# Patient Record
Sex: Male | Born: 1975 | Race: White | Hispanic: No | Marital: Married | Smoking: Never smoker
Health system: Southern US, Community
[De-identification: ages and names within clinical notes are randomized; demographics above are authoritative.]

## PROBLEM LIST (undated history)

## (undated) DIAGNOSIS — F1010 Alcohol abuse, uncomplicated: Secondary | ICD-10-CM

## (undated) HISTORY — PX: EYE SURGERY: SHX253

---

## 1999-11-20 ENCOUNTER — Ambulatory Visit (HOSPITAL_BASED_OUTPATIENT_CLINIC_OR_DEPARTMENT_OTHER): Admission: RE | Admit: 1999-11-20 | Discharge: 1999-11-20 | Payer: Self-pay | Admitting: Otolaryngology

## 2016-03-08 DIAGNOSIS — Z7189 Other specified counseling: Secondary | ICD-10-CM | POA: Diagnosis not present

## 2016-03-08 DIAGNOSIS — I10 Essential (primary) hypertension: Secondary | ICD-10-CM | POA: Diagnosis not present

## 2016-03-08 DIAGNOSIS — F329 Major depressive disorder, single episode, unspecified: Secondary | ICD-10-CM | POA: Diagnosis not present

## 2017-08-07 DIAGNOSIS — F331 Major depressive disorder, recurrent, moderate: Secondary | ICD-10-CM | POA: Diagnosis not present

## 2017-08-07 DIAGNOSIS — S0340XA Sprain of jaw, unspecified side, initial encounter: Secondary | ICD-10-CM | POA: Diagnosis not present

## 2017-08-07 DIAGNOSIS — R293 Abnormal posture: Secondary | ICD-10-CM | POA: Diagnosis not present

## 2017-09-05 DIAGNOSIS — Z23 Encounter for immunization: Secondary | ICD-10-CM | POA: Diagnosis not present

## 2018-04-16 DIAGNOSIS — M9903 Segmental and somatic dysfunction of lumbar region: Secondary | ICD-10-CM | POA: Diagnosis not present

## 2018-04-16 DIAGNOSIS — M9901 Segmental and somatic dysfunction of cervical region: Secondary | ICD-10-CM | POA: Diagnosis not present

## 2018-04-16 DIAGNOSIS — M9902 Segmental and somatic dysfunction of thoracic region: Secondary | ICD-10-CM | POA: Diagnosis not present

## 2018-04-17 DIAGNOSIS — M9903 Segmental and somatic dysfunction of lumbar region: Secondary | ICD-10-CM | POA: Diagnosis not present

## 2018-04-17 DIAGNOSIS — F4323 Adjustment disorder with mixed anxiety and depressed mood: Secondary | ICD-10-CM | POA: Diagnosis not present

## 2018-04-17 DIAGNOSIS — M9902 Segmental and somatic dysfunction of thoracic region: Secondary | ICD-10-CM | POA: Diagnosis not present

## 2018-04-17 DIAGNOSIS — M5030 Other cervical disc degeneration, unspecified cervical region: Secondary | ICD-10-CM | POA: Diagnosis not present

## 2018-04-17 DIAGNOSIS — M9901 Segmental and somatic dysfunction of cervical region: Secondary | ICD-10-CM | POA: Diagnosis not present

## 2018-04-22 DIAGNOSIS — M9903 Segmental and somatic dysfunction of lumbar region: Secondary | ICD-10-CM | POA: Diagnosis not present

## 2018-04-22 DIAGNOSIS — M9901 Segmental and somatic dysfunction of cervical region: Secondary | ICD-10-CM | POA: Diagnosis not present

## 2018-04-22 DIAGNOSIS — M9902 Segmental and somatic dysfunction of thoracic region: Secondary | ICD-10-CM | POA: Diagnosis not present

## 2018-04-22 DIAGNOSIS — M5030 Other cervical disc degeneration, unspecified cervical region: Secondary | ICD-10-CM | POA: Diagnosis not present

## 2018-04-24 DIAGNOSIS — M5030 Other cervical disc degeneration, unspecified cervical region: Secondary | ICD-10-CM | POA: Diagnosis not present

## 2018-04-24 DIAGNOSIS — M9902 Segmental and somatic dysfunction of thoracic region: Secondary | ICD-10-CM | POA: Diagnosis not present

## 2018-04-24 DIAGNOSIS — F4323 Adjustment disorder with mixed anxiety and depressed mood: Secondary | ICD-10-CM | POA: Diagnosis not present

## 2018-04-24 DIAGNOSIS — M9903 Segmental and somatic dysfunction of lumbar region: Secondary | ICD-10-CM | POA: Diagnosis not present

## 2018-04-24 DIAGNOSIS — M9901 Segmental and somatic dysfunction of cervical region: Secondary | ICD-10-CM | POA: Diagnosis not present

## 2018-04-27 DIAGNOSIS — M9901 Segmental and somatic dysfunction of cervical region: Secondary | ICD-10-CM | POA: Diagnosis not present

## 2018-04-27 DIAGNOSIS — M9903 Segmental and somatic dysfunction of lumbar region: Secondary | ICD-10-CM | POA: Diagnosis not present

## 2018-04-27 DIAGNOSIS — M9902 Segmental and somatic dysfunction of thoracic region: Secondary | ICD-10-CM | POA: Diagnosis not present

## 2018-04-27 DIAGNOSIS — M5030 Other cervical disc degeneration, unspecified cervical region: Secondary | ICD-10-CM | POA: Diagnosis not present

## 2018-04-29 DIAGNOSIS — M9902 Segmental and somatic dysfunction of thoracic region: Secondary | ICD-10-CM | POA: Diagnosis not present

## 2018-04-29 DIAGNOSIS — M9903 Segmental and somatic dysfunction of lumbar region: Secondary | ICD-10-CM | POA: Diagnosis not present

## 2018-04-29 DIAGNOSIS — M9901 Segmental and somatic dysfunction of cervical region: Secondary | ICD-10-CM | POA: Diagnosis not present

## 2018-04-29 DIAGNOSIS — M5030 Other cervical disc degeneration, unspecified cervical region: Secondary | ICD-10-CM | POA: Diagnosis not present

## 2018-05-01 DIAGNOSIS — M5030 Other cervical disc degeneration, unspecified cervical region: Secondary | ICD-10-CM | POA: Diagnosis not present

## 2018-05-01 DIAGNOSIS — M9903 Segmental and somatic dysfunction of lumbar region: Secondary | ICD-10-CM | POA: Diagnosis not present

## 2018-05-01 DIAGNOSIS — M9901 Segmental and somatic dysfunction of cervical region: Secondary | ICD-10-CM | POA: Diagnosis not present

## 2018-05-01 DIAGNOSIS — M9902 Segmental and somatic dysfunction of thoracic region: Secondary | ICD-10-CM | POA: Diagnosis not present

## 2018-05-04 DIAGNOSIS — M9901 Segmental and somatic dysfunction of cervical region: Secondary | ICD-10-CM | POA: Diagnosis not present

## 2018-05-04 DIAGNOSIS — M9902 Segmental and somatic dysfunction of thoracic region: Secondary | ICD-10-CM | POA: Diagnosis not present

## 2018-05-04 DIAGNOSIS — M9903 Segmental and somatic dysfunction of lumbar region: Secondary | ICD-10-CM | POA: Diagnosis not present

## 2018-05-04 DIAGNOSIS — M5030 Other cervical disc degeneration, unspecified cervical region: Secondary | ICD-10-CM | POA: Diagnosis not present

## 2018-05-06 DIAGNOSIS — M9902 Segmental and somatic dysfunction of thoracic region: Secondary | ICD-10-CM | POA: Diagnosis not present

## 2018-05-06 DIAGNOSIS — M9901 Segmental and somatic dysfunction of cervical region: Secondary | ICD-10-CM | POA: Diagnosis not present

## 2018-05-06 DIAGNOSIS — M5030 Other cervical disc degeneration, unspecified cervical region: Secondary | ICD-10-CM | POA: Diagnosis not present

## 2018-05-06 DIAGNOSIS — M9903 Segmental and somatic dysfunction of lumbar region: Secondary | ICD-10-CM | POA: Diagnosis not present

## 2018-05-08 DIAGNOSIS — M9901 Segmental and somatic dysfunction of cervical region: Secondary | ICD-10-CM | POA: Diagnosis not present

## 2018-05-08 DIAGNOSIS — M5030 Other cervical disc degeneration, unspecified cervical region: Secondary | ICD-10-CM | POA: Diagnosis not present

## 2018-05-08 DIAGNOSIS — M9903 Segmental and somatic dysfunction of lumbar region: Secondary | ICD-10-CM | POA: Diagnosis not present

## 2018-05-08 DIAGNOSIS — M9902 Segmental and somatic dysfunction of thoracic region: Secondary | ICD-10-CM | POA: Diagnosis not present

## 2018-06-11 ENCOUNTER — Other Ambulatory Visit: Payer: Self-pay

## 2018-06-11 ENCOUNTER — Emergency Department (HOSPITAL_COMMUNITY)
Admission: EM | Admit: 2018-06-11 | Discharge: 2018-06-11 | Disposition: A | Discharge status: Home / Self Care | Payer: BLUE CROSS/BLUE SHIELD | Attending: Emergency Medicine | Admitting: Emergency Medicine

## 2018-06-11 ENCOUNTER — Emergency Department (HOSPITAL_COMMUNITY): Payer: BLUE CROSS/BLUE SHIELD

## 2018-06-11 ENCOUNTER — Encounter (HOSPITAL_COMMUNITY): Payer: Self-pay

## 2018-06-11 DIAGNOSIS — W2209XA Striking against other stationary object, initial encounter: Secondary | ICD-10-CM | POA: Insufficient documentation

## 2018-06-11 DIAGNOSIS — S0993XA Unspecified injury of face, initial encounter: Secondary | ICD-10-CM | POA: Diagnosis not present

## 2018-06-11 DIAGNOSIS — Y939 Activity, unspecified: Secondary | ICD-10-CM | POA: Insufficient documentation

## 2018-06-11 DIAGNOSIS — Y999 Unspecified external cause status: Secondary | ICD-10-CM | POA: Insufficient documentation

## 2018-06-11 DIAGNOSIS — Y929 Unspecified place or not applicable: Secondary | ICD-10-CM | POA: Diagnosis not present

## 2018-06-11 HISTORY — DX: Alcohol abuse, uncomplicated: F10.10

## 2018-06-11 NOTE — Discharge Instructions (Addendum)
Your x-rays today were normal.  There is a slight deviation on the your nose towards the right.  I have provided a referral for an ENT specialist, please schedule an appointment as needed.  If any of your symptoms worsen or you experience any pain with eye movement, changes in your ability to smell return to the ED for reevaluation

## 2018-06-11 NOTE — ED Provider Notes (Signed)
[facility] EMERGENCY DEPARTMENT Provider Note   CSN: [id number] Arrival date & time: 06/11/18  2036     History   Chief Complaint Chief Complaint  Patient presents with  . Facial Injury    nose    HPI Christian Hawkins is a 42 y.o. male.  42 y/o male with no PMH presents to the ED s/p facial injury x 2 hours ago.  Patient reports he was closing the door of his hatchback car when he struck his nose against the door.  He states this was a severe impact.  He reports there was blood coming from the bridge of his nose but no internal blood.  He applied pressure to his nose and reports he got the bleeding to stop on the bridge of his nose.  He has not placed ice or taken any pain for relief.  He denies any difficulty with smell or decrease in smell, pain with eye movement, other complaints or headache.     Past Medical History:  Diagnosis Date  . Alcohol abuse    quit 4 years ago    There are no active problems to display for this patient.   Past Surgical History:  Procedure Laterality Date  . EYE SURGERY          Home Medications    Prior to Admission medications   Not on File    Family History History reviewed. No pertinent family history.  Social History Social History   Tobacco Use  . Smoking status: Never Smoker  . Smokeless tobacco: Never Used  Substance Use Topics  . Alcohol use: Yes  . Drug use: Never     Allergies   Patient has no known allergies.   Review of Systems Review of Systems  Constitutional: Negative for chills and fever.  HENT: Negative for nosebleeds and rhinorrhea.      Physical Exam Updated Vital Signs BP 120/85 (BP Location: Right Arm)   Pulse 82   Temp 98.7 F (37.1 C) (Oral)   Resp 15   SpO2 98%   Physical Exam  HENT:  Head: Normocephalic.    Nose: Nasal deformity present. No mucosal edema or rhinorrhea. No epistaxis.  No foreign bodies. Right sinus exhibits no maxillary sinus tenderness and  no frontal sinus tenderness. Left sinus exhibits no maxillary sinus tenderness and no frontal sinus tenderness.  No seems to be deviated slightly to the right side but patient reports no decrease in smelling, pain with eye movement, pain with pressing of his nose.     ED Treatments / Results  Labs (all labs ordered are listed, but only abnormal results are displayed) Labs Reviewed - No data to display  EKG None  Radiology Dg Nasal Bones  Result Date: 06/11/2018 CLINICAL DATA:  Nose injury.  Ran into door. EXAM: NASAL BONES - 3+ VIEW COMPARISON:  None. FINDINGS: There is no evidence of fracture or other bone abnormality. IMPRESSION: Negative. Electronically Signed   By: Charlett Nose M.D.   On: 06/11/2018 22:05    Procedures Procedures (including critical care time)  Medications Ordered in ED Medications - No data to display   Initial Impression / Assessment and Plan / ED Course  I have reviewed the triage vital signs and the nursing notes.  Pertinent labs & imaging results that were available during my care of the patient were reviewed by me and considered in my medical decision making (see chart for details).  Presents with visual injury after his  nose with the door of his hatchback.  Bleeding is controlled.  DG nasal bones ordered there is no evidence of fracture or other bone abnormality.  Patient has no decrease in smelling, epistaxis, pain with eye movement.  At this time I have advised patient I will provided with a referral to ENT should he have any symptoms he needs to follow-up with them.  There is a slight deviation to his right towards the right side.  She may take Tylenol or ibuprofen for the pain.  Vitals stable during ED visit, patient stable for discharge.  Final Clinical Impressions(s) / ED Diagnoses   Final diagnoses:  Facial injury, initial encounter    ED Discharge Orders    None       Claude Manges, Cordelia Poche 06/11/18 2305    Jacalyn Lefevre, MD 06/11/18  2306

## 2018-06-11 NOTE — ED Triage Notes (Signed)
Pt here for running into the door of his car and feeling as if he broke his nose.  Broke his nose in the past.  Having swelling to bilateral lower orbits.  No difficulty breathing, no blood from the nose.  Small abrasion to the bridge of the nose. A&Ox4

## 2018-06-11 NOTE — ED Notes (Signed)
Pt reports hitting his nose on the hatchback of his vehicle. Pt believes his nose is broken but endorses no pain.

## 2019-08-16 IMAGING — CR DG NASAL BONES 3+V
3 series · 3 of 3 positions shown · non-contrast
Comparison: None.

CLINICAL DATA: Nose injury.  Ran into door.

EXAM:
NASAL BONES - 3+ VIEW

[nasal waters]
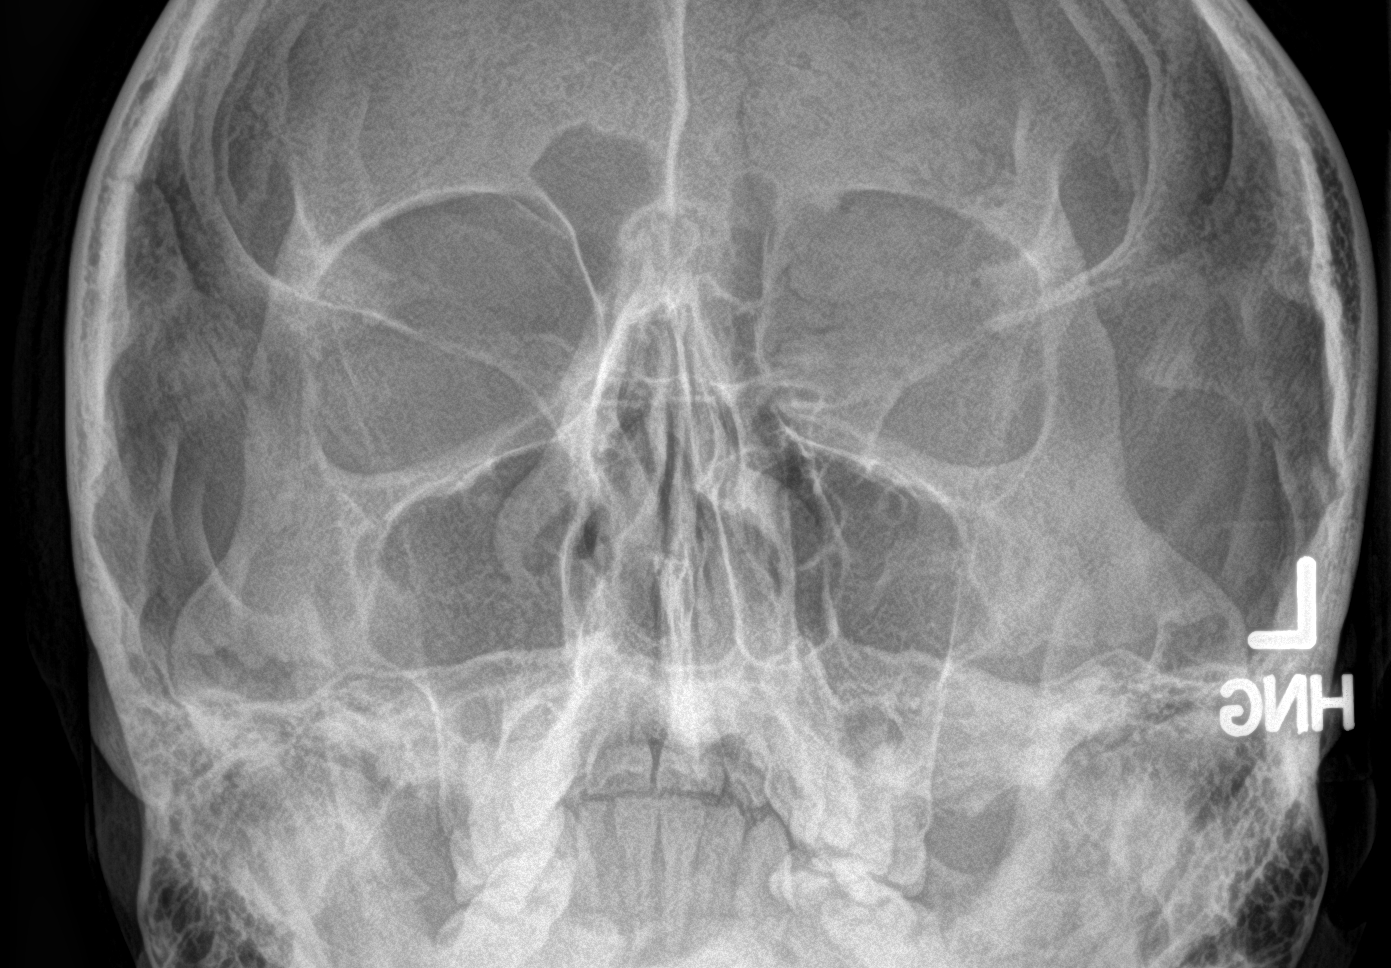

[nasal lat (1 of 2)]
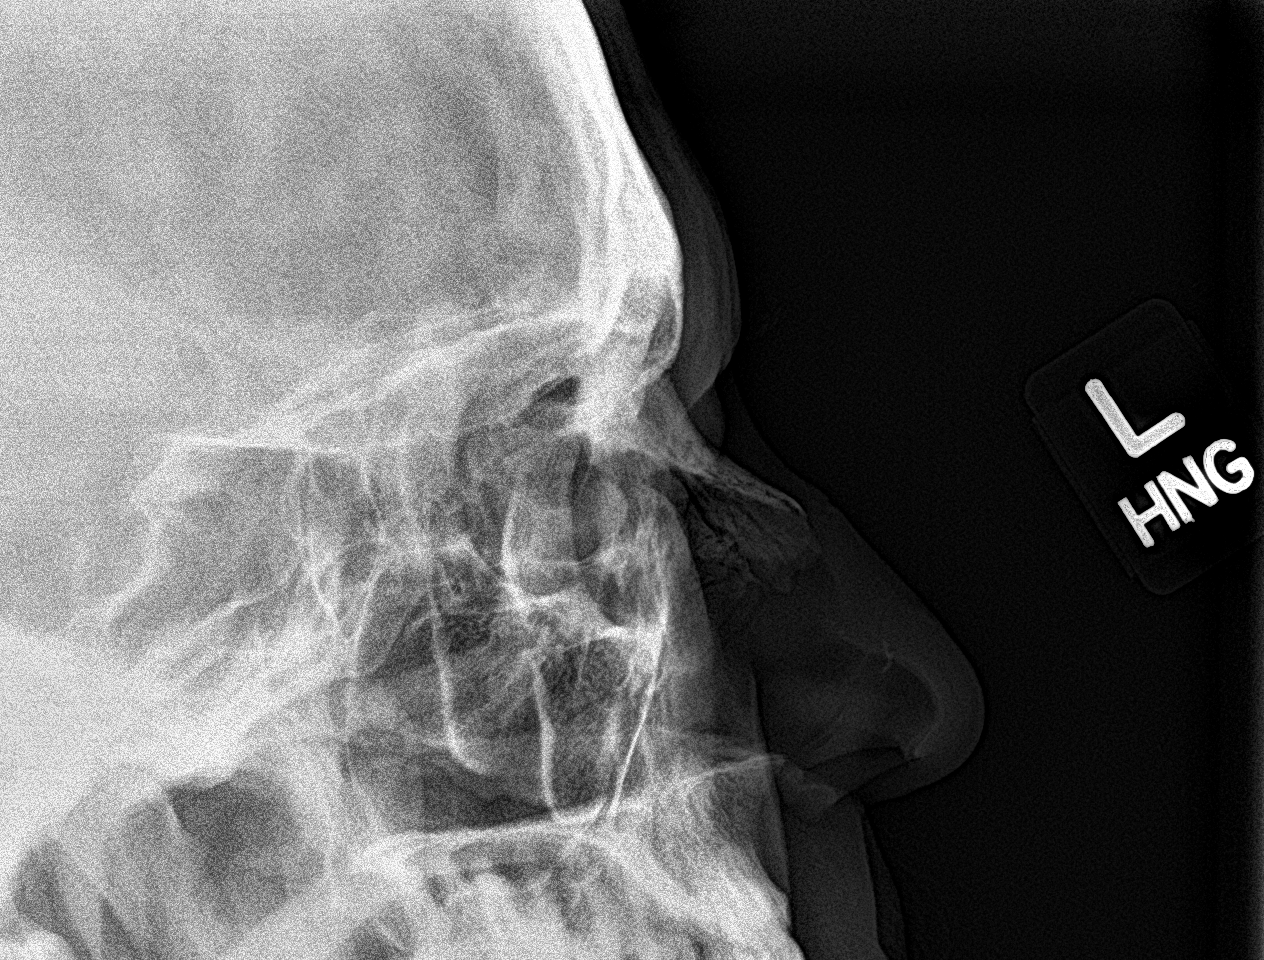

[nasal lat (2 of 2)]
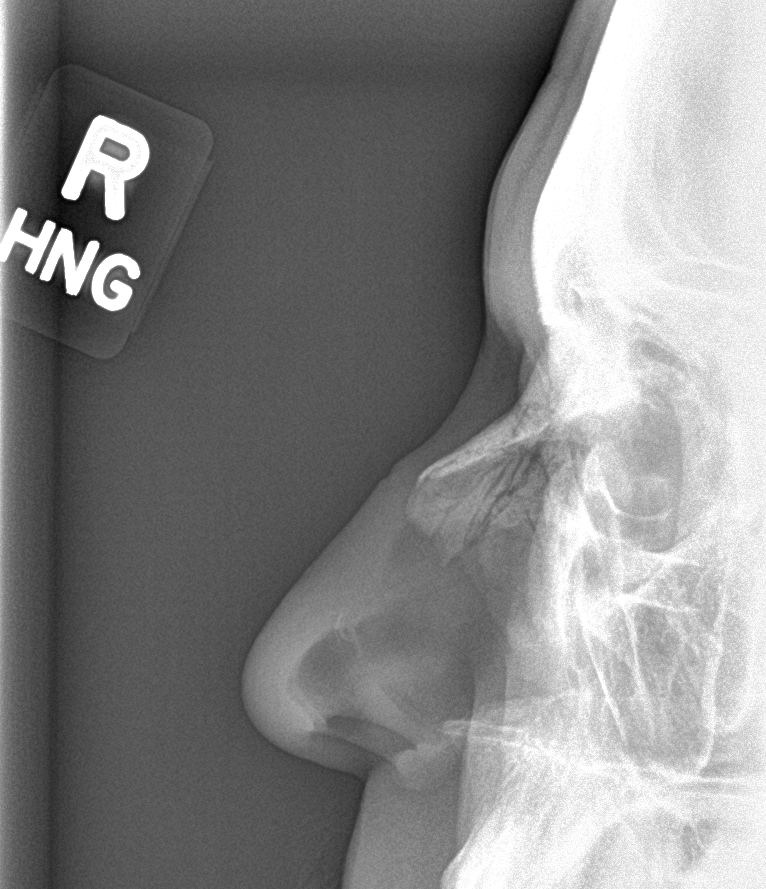

[3 of 3 positions shown; findings below may reference images not displayed]

FINDINGS: There is no evidence of fracture or other bone abnormality.
IMPRESSION: Negative.

## 2019-10-07 ENCOUNTER — Ambulatory Visit: Payer: Self-pay | Admitting: Podiatry

## 2019-10-12 ENCOUNTER — Ambulatory Visit (INDEPENDENT_AMBULATORY_CARE_PROVIDER_SITE_OTHER): Payer: 59

## 2019-10-12 ENCOUNTER — Encounter: Payer: Self-pay | Admitting: Podiatry

## 2019-10-12 ENCOUNTER — Other Ambulatory Visit: Payer: Self-pay

## 2019-10-12 ENCOUNTER — Ambulatory Visit: Payer: 59 | Admitting: Podiatry

## 2019-10-12 VITALS — BP 139/80 | HR 67 | Temp 96.4°F | Resp 16

## 2019-10-12 DIAGNOSIS — M722 Plantar fascial fibromatosis: Secondary | ICD-10-CM

## 2019-10-12 MED ORDER — MELOXICAM 15 MG PO TABS: 15.0000 mg | ORAL_TABLET | Freq: Every day | ORAL | 3 refills | Status: AC

## 2019-10-12 MED ORDER — METHYLPREDNISOLONE 4 MG PO TBPK: ORAL_TABLET | ORAL | 0 refills | Status: AC

## 2019-10-12 NOTE — Patient Instructions (Signed)

## 2019-10-13 NOTE — Progress Notes (Signed)
  Subjective:  Patient ID: Christian Hawkins, male    DOB: 08-12-1976,  MRN: [id number] HPI Chief Complaint  Patient presents with  . Foot Pain    Plantar heel bilateral (L>R) - aching x couple months, been walking more since gym closures, treid stretching, has hiking trip in June  . New Patient (Initial Visit)    44 y.o. male presents with the above complaint.   ROS: Denies fever chills nausea vomiting muscle aches pains calf pain back pain chest pain shortness of breath.  Past Medical History:  Diagnosis Date  . Alcohol abuse    quit 4 years ago   Past Surgical History:  Procedure Laterality Date  . EYE SURGERY      Current Outpatient Medications:  .  meloxicam (MOBIC) 15 MG tablet, Take 1 tablet (15 mg total) by mouth daily., Disp: 30 tablet, Rfl: 3 .  methylPREDNISolone (MEDROL DOSEPAK) 4 MG TBPK tablet, 6 day dose pack - take as directed, Disp: 21 tablet, Rfl: 0 .  sertraline (ZOLOFT) 100 MG tablet, Take 100 mg by mouth at bedtime., Disp: , Rfl:  .  sertraline (ZOLOFT) 50 MG tablet, Take 50 mg by mouth at bedtime., Disp: , Rfl:   No Known Allergies Review of Systems Objective:   Vitals:   10/12/19 1543  BP: 139/80  Pulse: 67  Resp: 16  Temp: (!) 96.4 F (35.8 C)    General: Well developed, nourished, in no acute distress, alert and oriented x3   Dermatological: Skin is warm, dry and supple bilateral. Nails x 10 are well maintained; remaining integument appears unremarkable at this time. There are no open sores, no preulcerative lesions, no rash or signs of infection present.  Vascular: Dorsalis Pedis artery and Posterior Tibial artery pedal pulses are 2/4 bilateral with immedate capillary fill time. Pedal hair growth present. No varicosities and no lower extremity edema present bilateral.   Neruologic: Grossly intact via light touch bilateral. Vibratory intact via tuning fork bilateral. Protective threshold with Semmes Wienstein monofilament intact to all pedal  sites bilateral. Patellar and Achilles deep tendon reflexes 2+ bilateral. No Babinski or clonus noted bilateral.   Musculoskeletal: No gross boney pedal deformities bilateral. No pain, crepitus, or limitation noted with foot and ankle range of motion bilateral. Muscular strength 5/5 in all groups tested bilateral.  Pain on palpation medial calcaneal tubercle of the right proximal medial longitudinal arch.  Left foot demonstrates more tenderness central calcaneal tubercle plantarly as well as the lateral aspect they does have some tenderness and thickening on palpation along the medial band of the plantar fascia.  Most likely compensatory lateral pain.  Gait: Unassisted, Nonantalgic.    Radiographs:  Radiographs taken today demonstrate cavus foot soft tissue increase in density at the plantar fashion calcaneal insertion site.  Left appears to be worse than the right.  No acute findings no fractures no dislocations.  Assessment & Plan:   Assessment: Mild pes cavus with plantar fasciitis bilateral left greater than right  Plan: Discussed etiology pathology conservative versus surgical therapies.  At this point injected the medial aspect of the bilateral heels after alcohol prep.  Injected 20 mg Kenalog 5 mg Marcaine bilaterally.  Placed on plantar fascial braces bilateral.  Discussed appropriate shoe gear stretching exercises ice therapy and shoe gear modifications.  Also started him on methylprednisolone to be completed and then followed with a One-A-Day meloxicam 15 mg.  Follow-up with him in 1 month     Lenay Lovejoy T. [place], [place]

## 2019-11-11 ENCOUNTER — Ambulatory Visit: Payer: 59 | Admitting: Podiatry

## 2020-11-14 ENCOUNTER — Ambulatory Visit (INDEPENDENT_AMBULATORY_CARE_PROVIDER_SITE_OTHER): Payer: 59

## 2020-11-14 ENCOUNTER — Ambulatory Visit: Payer: 59 | Admitting: Podiatry

## 2020-11-14 ENCOUNTER — Other Ambulatory Visit: Payer: Self-pay

## 2020-11-14 DIAGNOSIS — M216X1 Other acquired deformities of right foot: Secondary | ICD-10-CM

## 2020-11-14 DIAGNOSIS — M722 Plantar fascial fibromatosis: Secondary | ICD-10-CM | POA: Diagnosis not present

## 2020-11-14 DIAGNOSIS — M792 Neuralgia and neuritis, unspecified: Secondary | ICD-10-CM

## 2020-11-14 DIAGNOSIS — M216X2 Other acquired deformities of left foot: Secondary | ICD-10-CM | POA: Diagnosis not present

## 2020-11-14 DIAGNOSIS — M79671 Pain in right foot: Secondary | ICD-10-CM | POA: Diagnosis not present

## 2020-11-14 DIAGNOSIS — M21861 Other specified acquired deformities of right lower leg: Secondary | ICD-10-CM

## 2020-11-14 DIAGNOSIS — M79672 Pain in left foot: Secondary | ICD-10-CM | POA: Diagnosis not present

## 2020-11-14 DIAGNOSIS — M21862 Other specified acquired deformities of left lower leg: Secondary | ICD-10-CM

## 2020-11-14 NOTE — Patient Instructions (Signed)
  EXERCISES:  RANGE OF MOTION (ROM) AND STRETCHING EXERCISES   Your symptoms may resolve with or without further involvement from your physician, physical therapist or athletic trainer. While completing these exercises, remember:   Restoring tissue flexibility helps normal motion to return to the joints. This allows healthier, less painful movement and activity.  An effective stretch should be held for at least 30 seconds.  A stretch should never be painful. You should only feel a gentle lengthening or release in the stretched tissue.  STRETCH  Gastroc, Standing   Place hands on wall.  Extend right / left leg, keeping the front knee somewhat bent.  Slightly point your toes inward on your back foot.  Keeping your right / left heel on the floor and your knee straight, shift your weight toward the wall, not allowing your back to arch.  You should feel a gentle stretch in the right / left calf. Hold this position for 10 seconds. Repeat 3 times. Complete this stretch 2 times per day.  STRETCH  Soleus, Standing   Place hands on wall.  Extend right / left leg, keeping the other knee somewhat bent.  Slightly point your toes inward on your back foot.  Keep your right / left heel on the floor, bend your back knee, and slightly shift your weight over the back leg so that you feel a gentle stretch deep in your back calf.  Hold this position for 10 seconds. Repeat 3 times. Complete this stretch 2 times per day.  STRETCH  Gastrocsoleus, Standing  Note: This exercise can place a lot of stress on your foot and ankle. Please complete this exercise only if specifically instructed by your caregiver.   Place the ball of your right / left foot on a step, keeping your other foot firmly on the same step.  Hold on to the wall or a rail for balance.  Slowly lift your other foot, allowing your body weight to press your heel down over the edge of the step.  You should feel a stretch in your right  / left calf.  Hold this position for 10 seconds.  Repeat this exercise with a slight bend in your knee. Repeat 3 times. Complete this stretch 2 times per day.

## 2020-11-15 ENCOUNTER — Encounter: Payer: Self-pay | Admitting: Podiatry

## 2020-11-15 ENCOUNTER — Other Ambulatory Visit: Payer: Self-pay | Admitting: Podiatry

## 2020-11-15 DIAGNOSIS — M722 Plantar fascial fibromatosis: Secondary | ICD-10-CM

## 2020-11-15 NOTE — Progress Notes (Signed)
  Subjective:  Patient ID: Christian Hawkins, male    DOB: 1976/07/22,  MRN: [id number]  Chief Complaint  Patient presents with  . Foot Pain    PT stated that he has been having pain In both feet for a few months and the pain is only in the top of the foot and comes on when he walks for a long periods of time    45 y.o. male presents with the above complaint. History confirmed with patient.   Objective:  Physical Exam: warm, good capillary refill, no trophic changes or ulcerative lesions, normal DP and PT pulses and normal sensory exam.  Significant gastrocnemius equinus is present.  He has 5/5 strength in all planes.  Unable to reproduce neuritic or painful symptoms   Radiographs: X-ray of both feet: no fracture, dislocation, swelling or degenerative changes noted Assessment:   1. Pain in both feet   2. Gastrocnemius equinus of left lower extremity   3. Gastrocnemius equinus of right lower extremity   4. Neuritis      Plan:  Patient was evaluated and treated and all questions answered.  Reviewed clinical and radiographic findings with the patient in detail.  He has been trying to be more active and run.  Do not see any abnormalities radiographically or clinically that would warrant further imaging or procedural intervention.  Discussed with him she lacing patterns that he may be compressing the dorsal cutaneous nerves or if causing transient neuritis.  Also discussed with him that this could be extensor substitution tendinitis secondary to his significant gastrocnemius equinus Correction to stretch twice daily.  Also discussed if this is an appropriate surgical lengthening of the gastrocnemius muscle could improve his symptoms.  Return if symptoms worsen or fail to improve.

## 2023-08-11 ENCOUNTER — Ambulatory Visit: Payer: BLUE CROSS/BLUE SHIELD | Admitting: Orthopedic Surgery

## 2023-09-11 ENCOUNTER — Ambulatory Visit: Payer: 59 | Admitting: Orthopedic Surgery

## 2023-09-11 ENCOUNTER — Other Ambulatory Visit (INDEPENDENT_AMBULATORY_CARE_PROVIDER_SITE_OTHER): Payer: 59

## 2023-09-11 VITALS — BP 127/84 | HR 76 | Ht 71.0 in | Wt 205.0 lb

## 2023-09-11 DIAGNOSIS — M545 Low back pain, unspecified: Secondary | ICD-10-CM

## 2023-09-11 NOTE — Progress Notes (Signed)
Orthopedic Spine Surgery Office Note  Assessment: Patient is a 48 y.o. male with concern for decreased flexibility in his left hip and lower lumbar spine   Plan: -Explained that initially conservative treatment is tried as a significant number of patients may experience relief with these treatment modalities. Discussed that the conservative treatments include:  -activity modification  -physical therapy  -over the counter pain medications  -medrol dosepak  -lumbar steroid injections -Patient has not tried any treatments recently -Recommended PT -I told him if he does not notice any improvement with physical therapy, could start a workup for ankylosing spondylitis.  I explained it is a rare condition is usually seen is slightly under patient so I want to hold off on starting workup for that at this point -Patient should return to office in 8 weeks, x-rays at next visit: None   Patient expressed understanding of the plan and all questions were answered to the patient's satisfaction.   ___________________________________________________________________________   History:  Patient is a 48 y.o. male who presents today for lumbar spine.  Patient states over the last year he has noticed decreased range of motion in his lumbar spine and his left hip.  He has not had any pain radiating into either of his lower extremities.  He does not have any pain in his lower back.  He has been trying some home stretches but has not noticed any significant improvement.  He has no family history of ankylosing spondylitis.  He has had no posterior hip pain recently or within the last year or so.   Weakness: Denies Symptoms of imbalance: Denies Paresthesias and numbness: Denies Bowel or bladder incontinence: Denies Saddle anesthesia: Denies  Treatments tried: No recent treatments  Review of systems: Denies fevers and chills, night sweats, unexplained weight loss, history of cancer, pain that wakes him at  night  Past medical history: HTN Depression/anxiety  Allergies: NKDA  Past surgical history:  Childhood eye surgery  Social history: Denies use of nicotine product (smoking, vaping, patches, smokeless) Alcohol use: denies Denies recreational drug use   Physical Exam:  BMI of 28.6  General: no acute distress, appears stated age Neurologic: alert, answering questions appropriately, following commands Respiratory: unlabored breathing on room air, symmetric chest rise Psychiatric: appropriate affect, normal cadence to speech   MSK (spine):  -Strength exam      Left  Right EHL    5/5  5/5 TA    5/5  5/5 GSC    5/5  5/5 Knee extension  5/5  5/5 Hip flexion   5/5  5/5  -Sensory exam    Sensation intact to light touch in L3-S1 nerve distributions of bilateral lower extremities  -Achilles DTR: 2/4 on the left, 2/4 on the right -Patellar tendon DTR: 2/4 on the left, 2/4 on the right  -Straight leg raise: negative bilaterally -Femoral nerve stretch test: negative bilaterally -Clonus: no beats bilaterally  -Left hip exam: no pain through range of motion, decreased internal rotation when compared to contralateral side, negative faber, negative si joint compression test, negative gaenslens  -Right hip exam: no pain through range of motion, negative faber, negative si joint compression test, negative gaenslens   Imaging: XRs of the lumbar spine from 09/11/2023 were independently reviewed and interpreted, showing disc height loss at L3/4 and L4/5. Anterior osteophyte formation seen at T11/12, T12/L1, and L3/4. No other significant degenerative changes seen. No evidence of instability on flexion/extension views. No fracture or dislocation seen.    Patient name: Christian Hawkins  Christian Hawkins Patient MRN: [id number] Date of visit: 09/11/23

## 2023-09-25 ENCOUNTER — Other Ambulatory Visit: Payer: Self-pay | Admitting: Medical Genetics

## 2023-10-03 ENCOUNTER — Ambulatory Visit: Payer: 59 | Admitting: Physical Therapy

## 2023-10-16 ENCOUNTER — Other Ambulatory Visit (HOSPITAL_COMMUNITY)
Admission: RE | Admit: 2023-10-16 | Discharge: 2023-10-16 | Disposition: A | Discharge status: Home / Self Care | Payer: Self-pay | Source: Ambulatory Visit | Attending: Oncology | Admitting: Oncology

## 2023-10-17 ENCOUNTER — Ambulatory Visit: Payer: 59 | Admitting: Physical Therapy

## 2023-10-30 ENCOUNTER — Encounter: Payer: Self-pay | Admitting: Rehabilitative and Restorative Service Providers"

## 2023-10-30 ENCOUNTER — Ambulatory Visit: Payer: 59 | Admitting: Rehabilitative and Restorative Service Providers"

## 2023-10-30 DIAGNOSIS — M5459 Other low back pain: Secondary | ICD-10-CM

## 2023-10-30 DIAGNOSIS — R293 Abnormal posture: Secondary | ICD-10-CM

## 2023-10-30 DIAGNOSIS — M25552 Pain in left hip: Secondary | ICD-10-CM | POA: Diagnosis not present

## 2023-10-30 DIAGNOSIS — M25562 Pain in left knee: Secondary | ICD-10-CM

## 2023-10-30 DIAGNOSIS — M6281 Muscle weakness (generalized): Secondary | ICD-10-CM

## 2023-10-30 DIAGNOSIS — M542 Cervicalgia: Secondary | ICD-10-CM

## 2023-10-30 DIAGNOSIS — G8929 Other chronic pain: Secondary | ICD-10-CM

## 2023-10-30 NOTE — Therapy (Signed)
 OUTPATIENT PHYSICAL THERAPY EVALUATION   Patient Name: Christian Hawkins MRN: [id number] DOB:[birth year], 48 y.o., male Today's Date: 10/30/2023  END OF SESSION:  PT End of Session - 10/30/23 0931     Visit Number 1    Number of Visits 20    Date for PT Re-Evaluation 01/08/24    Authorization Type [employer] $35 copay , 20 PT visits    Progress Note Due on Visit 10    PT Start Time 0935    PT Stop Time 1016    PT Time Calculation (min) 41 min    Activity Tolerance Patient tolerated treatment well    Behavior During Therapy WFL for tasks assessed/performed             Past Medical History:  Diagnosis Date   Alcohol abuse    quit 4 years ago   Past Surgical History:  Procedure Laterality Date   EYE SURGERY     There are no active problems to display for this patient.   PCP: Lind Covert MD  REFERRING PROVIDER: London Sheer, MD  REFERRING DIAG: M54.50 (ICD-10-CM) - Low back pain, unspecified back pain laterality, unspecified chronicity, unspecified whether sciatica present  Rationale for Evaluation and Treatment: Rehabilitation  THERAPY DIAG:  Other low back pain  Pain in left hip  Chronic pain of left knee  Cervicalgia  Abnormal posture  Muscle weakness (generalized)  ONSET DATE: Over last year 2024  SUBJECTIVE:                                                                                                                                                                                           SUBJECTIVE STATEMENT: Complaints of insidious onset of pain/restricted movement from back, Lt hip that was discussed at MD office.  Pt mentioned complaints in Lt knee with movement, specifically with IR mobility.   Pt indicated history of mild scoliosis and some difference in shoulder heights with complaints of Rt neck pain primary with sometimes on Lt.  Pt indicated various symptoms impacted by various movements (see below).  Pt indicated some difficulty with  sleeping due to symptoms.  No numbness/tingling in LE.  Some occasional numbness in Lt arm (lying in bed holding item above).   PERTINENT HISTORY:  Per MD visit:  Patient states over the last year he has noticed decreased range of motion in his lumbar spine and his left hip. He has not had any pain radiating into either of his lower extremities. He does not have any pain in his lower back. He has been trying some home stretches but has not noticed any significant improvement. He  has no family history of ankylosing spondylitis. He has had no posterior hip pain recently or within the last year or so.   PAIN:  NPRS scale: at worst:   neck: 6/10, back: 6-7/10    Lt hip 1-2/10,  knee:  4/10 Pain location: back, Lt hip, neck bilateral Pain description: achy, pressure , spasms Aggravating factors: back/hip: increased use   knee:  kneeling, twisting.  Neck:  sitting prolonged/sleeping Relieving factors: massage to knots, stretching in general  PRECAUTIONS: None  WEIGHT BEARING RESTRICTIONS: No  FALLS:  Has patient fallen in last 6 months? No  LIVING ENVIRONMENT: Lives in: House/apartment  OCCUPATION: Programmer with sitting desk  PLOF: Independent, Rt hand dominant, has gym access but limited use so far.   PATIENT GOALS: Improve flexibility, reduce muscle tightness/pain.    OBJECTIVE:   DIAGNOSTIC FINDINGS:  Imaging: XRs of the lumbar spine from 09/11/2023 were independently reviewed and interpreted, showing disc height loss at L3/4 and L4/5. Anterior osteophyte formation seen at T11/12, T12/L1, and L3/4. No other significant degenerative changes seen. No evidence of instability on flexion/extension views. No fracture or dislocation seen.   PATIENT SURVEYS:  Patient-Specific Activity Scoring Scheme  "0" represents "unable to perform." "10" represents "able to perform at prior level. 0 1 2 3 4 5 6 7 8 9  10 (Date and Score)   Activity 10/30/2023    1. Exercise without knee pain 7/10      2. Sleeping  2/10    3.     4.    5.    Score 4.5    Total score = sum of the activity scores/number of activities Minimum detectable change (90%CI) for average score = 2 points Minimum detectable change (90%CI) for single activity score = 3 points  SCREENING FOR RED FLAGS: 10/30/2023 Bowel or bladder incontinence: No Cauda equina syndrome: No  COGNITION: 10/30/2023 Overall cognitive status: WFL normal      SENSATION: 10/30/2023 WFL  MUSCLE LENGTH: 10/30/2023 Passive SLR Lt and Rt to 90 deg bilateral.  Ely's (+) bilateral   POSTURE:  10/30/2023 Reduced lumbar lordosis, increased lower thoracic kyphosis with reduced curvature in mid and upper thoracic region.  Rt shoulders mild to Rt compared to hips in standing.   PALPATION: 10/30/2023 No specific tenderness to touch today in lower back/hip.  Trigger points noted in bilateral upper trap with local concordant symptoms.   LUMBAR ROM:  10/30/2023 Directional Preference Assessment: Centralization: Peripheralization:   AROM 10/30/2023  Flexion To toes, similar tightness bilateral thighs  Extension 75 % WFL with   Right lateral flexion   Left lateral flexion   Right rotation   Left rotation    (Blank rows = not tested)  LOWER EXTREMITY ROM:      Right 10/30/2023 Left 10/30/2023  Hip flexion    Hip extension    Hip abduction    Hip adduction    Hip internal rotation 42 PROM in 90 deg hip flexion supine 30  PROM in 90 deg hip flexion supine  Hip external rotation    Knee flexion    Knee extension    Ankle dorsiflexion    Ankle plantarflexion    Ankle inversion    Ankle eversion     (Blank rows = not tested)  LOWER EXTREMITY MMT:    MMT Right 10/30/2023 Left 10/30/2023  Hip flexion 5/5 5/5  Hip extension    Hip abduction    Hip adduction    Hip internal rotation  Hip external rotation    Knee flexion 5/5 5/5  Knee extension 5/5 108 lbs 5/5 112 lbs   Ankle dorsiflexion 5/5 5/5  Ankle plantarflexion     Ankle inversion    Ankle eversion     (Blank rows = not tested)  LUMBAR SPECIAL TESTS:  10/30/2023 (-) slump bilateral, (-) slump testing bilateral (+) ely's test for quad tightness bilateral  FUNCTIONAL TESTS:  10/30/2023 Functional squat with Lt knee anterior  18 inch chair s UE assist on 1 try.   GAIT: 10/30/2023 WFL related to LE control                                                                                                                                                                                                                   TODAY'S TREATMENT:                                                                                                         DATE: 10/30/2023  Therex:    HEP instruction/performance c cues for techniques, handout provided.  Trial set performed of each for comprehension and symptom assessment.  See below for exercise list.  Verbal cues and encouragement for routine use of postural mobility/activation in midst of work activity to promote mobility during day.   PATIENT EDUCATION:  10/30/2023 Education details: HEP, POC Person educated: Patient Education method: Programmer, multimedia, Demonstration, Verbal cues, and Handouts Education comprehension: verbalized understanding, returned demonstration, and verbal cues required  HOME EXERCISE PROGRAM: Access Code: [facility] URL: [email or web address] Date: 10/30/2023 Prepared by: Chyrel Masson  Exercises - Seated Scapular Retraction  - 3-5 x daily - 7 x weekly - 1 sets - 5-10 reps - 3-5 hold - Standing Lumbar Extension with Counter  - 3-5 x daily - 7 x weekly - 1 sets - 5-10 reps - Seated Upper Trapezius Stretch  - 2 x daily - 7 x weekly - 1 sets - 3-5 reps - 15-30 hold - Cervical Retraction at Wall  - 2 x daily - 7 x weekly - 1 sets - 10  reps - 5 hold - Seated Straight Leg Heel Taps  - 1-2 x daily - 7 x weekly - 2-3 sets - 10-15 reps - Supine Bridge  - 1-2 x daily - 7 x weekly - 1-2 sets -  10 reps - 2 hold - Sidelying Reverse Clamshell  - 1-2 x daily - 7 x weekly - 2-3 sets - 10 reps - Clamshell  - 1-2 x daily - 7 x weekly - 2-3 sets - 10 reps  ASSESSMENT:  CLINICAL IMPRESSION: Patient is a 48 y.o. who comes to clinic with complaints of multiple pain areas including back, neck, Lt hip, Lt knee pain with mobility, strength and movement coordination deficits that impair their ability to perform usual daily and recreational functional activities without increase difficulty/symptoms at this time.  Patient to benefit from skilled PT services to address impairments and limitations to improve to previous level of function without restriction secondary to condition.   OBJECTIVE IMPAIRMENTS: decreased activity tolerance, decreased balance, decreased coordination, decreased endurance, decreased mobility, difficulty walking, decreased ROM, decreased strength, increased fascial restrictions, impaired perceived functional ability, increased muscle spasms, impaired flexibility, improper body mechanics, postural dysfunction, and pain.   ACTIVITY LIMITATIONS: bending, sitting, standing, squatting, and sleeping  PARTICIPATION LIMITATIONS: interpersonal relationship, community activity, and occupation  PERSONAL FACTORS: Time since onset of injury/illness/exacerbation and 3+ comorbidities: multiple treatment areas noted, previous chronic spinal conditions including scoliosis  are also affecting patient's functional outcome.   REHAB POTENTIAL: Good  CLINICAL DECISION MAKING: Evolving/moderate complexity  EVALUATION COMPLEXITY: Moderate   GOALS: Goals reviewed with patient? Yes  SHORT TERM GOALS: (target date for Short term goals are 3 weeks 11/20/2023)  1. Patient will demonstrate independent use of home exercise program to maintain progress from in clinic treatments.  Goal status: New  LONG TERM GOALS: (target dates for all long term goals are 10 weeks  01/08/2024 )   1. Patient will  demonstrate/report pain at worst less than or equal to 2/10 to facilitate minimal limitation in daily activity secondary to pain symptoms.  Goal status: New   2. Patient will demonstrate independent use of home exercise program to facilitate ability to maintain/progress functional gains from skilled physical therapy services.  Goal status: New   3. Patient will demonstrate Patient specific functional scale avg > or = 8/10 to indicate reduced disability due to condition.   Goal status: New   4. Patient will demonstrate lumbar extension 100 % WFL s symptoms to facilitate upright standing, walking posture at PLOF s limitation.  Goal status: New   5.  Patient will demonstrate bilateral hip IR mobility > 45 deg to facilitate usual mobility at PLOF.   Goal status: New   6.  Patient will demonstrate/report ability to sleep s restriction due to pain symptoms.  Goal status: New    PLAN:  PT FREQUENCY: 1-2x/week  PT DURATION: 10 weeks  PLANNED INTERVENTIONS: Can include 16109- PT Re-evaluation, 97110-Therapeutic exercises, 97530- Therapeutic activity, 97112- Neuromuscular re-education, [phone]- Self Care, 97140- Manual therapy, [phone]- Gait training, [phone]- Orthotic Fit/training, [phone]- Canalith repositioning, [id number]- Aquatic Therapy, [phone]- Electrical stimulation (unattended), 97750 Physical performance testing, [id number]- Electrical stimulation (manual), 97016- Vasopneumatic device, [id number]- Ultrasound, [id number]- Traction (mechanical), [id number]- Ionotophoresis 4mg /ml Dexamethasone, Patient/Family education, Balance training, Stair training, Taping, Dry Needling, Joint mobilization, Joint manipulation, Spinal manipulation, Spinal mobilization, Scar mobilization, Vestibular training, Visual/preceptual remediation/compensation, DME instructions, Cryotherapy, and Moist heat.  All performed as medically necessary.  All included unless contraindicated  PLAN FOR NEXT SESSION: Review HEP  knowledge/results.  Possible  dry needling to upper trap.  Progressive postural strengthening/ activation.    Chyrel Masson, PT, DPT, OCS, ATC 10/30/23  10:30 AM

## 2023-11-05 ENCOUNTER — Encounter: Payer: Self-pay | Admitting: Rehabilitative and Restorative Service Providers"

## 2023-11-05 ENCOUNTER — Ambulatory Visit: Admitting: Rehabilitative and Restorative Service Providers"

## 2023-11-05 DIAGNOSIS — R293 Abnormal posture: Secondary | ICD-10-CM

## 2023-11-05 DIAGNOSIS — M5459 Other low back pain: Secondary | ICD-10-CM | POA: Diagnosis not present

## 2023-11-05 DIAGNOSIS — M25562 Pain in left knee: Secondary | ICD-10-CM | POA: Diagnosis not present

## 2023-11-05 DIAGNOSIS — G8929 Other chronic pain: Secondary | ICD-10-CM

## 2023-11-05 DIAGNOSIS — M25552 Pain in left hip: Secondary | ICD-10-CM

## 2023-11-05 DIAGNOSIS — M542 Cervicalgia: Secondary | ICD-10-CM | POA: Diagnosis not present

## 2023-11-05 DIAGNOSIS — M6281 Muscle weakness (generalized): Secondary | ICD-10-CM

## 2023-11-05 NOTE — Therapy (Signed)
 OUTPATIENT PHYSICAL THERAPY TREATMENT   Patient Name: Christian Hawkins MRN: [id number] DOB:05-02-1976, 48 y.o., male Today's Date: 11/05/2023  END OF SESSION:  PT End of Session - 11/05/23 0822     Visit Number 2    Number of Visits 20    Date for PT Re-Evaluation 01/08/24    Authorization Type [employer] $35 copay , 20 PT visits    Progress Note Due on Visit 10    PT Start Time 0807    PT Stop Time 0847    PT Time Calculation (min) 40 min    Activity Tolerance Patient tolerated treatment well    Behavior During Therapy [facility] for tasks assessed/performed              Past Medical History:  Diagnosis Date   Alcohol abuse    quit 4 years ago   Past Surgical History:  Procedure Laterality Date   EYE SURGERY     There are no active problems to display for this patient.   PCP: Lind Covert MD  REFERRING PROVIDER: London Sheer, MD  REFERRING DIAG: M54.50 (ICD-10-CM) - Low back pain, unspecified back pain laterality, unspecified chronicity, unspecified whether sciatica present  Rationale for Evaluation and Treatment: Rehabilitation  THERAPY DIAG:  Other low back pain  Pain in left hip  Chronic pain of left knee  Cervicalgia  Abnormal posture  Muscle weakness (generalized)  ONSET DATE: Over last year 2024  SUBJECTIVE:                                                                                                                                                                                           SUBJECTIVE STATEMENT: Pt indicated working on the exercises mostly.  Pt indicated still having knots in the neck and can notice it from sleeping.   PERTINENT HISTORY:  Per MD visit:  Patient states over the last year he has noticed decreased range of motion in his lumbar spine and his left hip. He has not had any pain radiating into either of his lower extremities. He does not have any pain in his lower back. He has been trying some home stretches but has not  noticed any significant improvement. He has no family history of ankylosing spondylitis. He has had no posterior hip pain recently or within the last year or so.   PAIN:  NPRS scale: at current:   neck: 2/10, back: not noted  Lt hip 1-2/10,  knee: 0/10 at rest.  Pain location: back, Lt hip, neck bilateral Pain description: achy, pressure , spasms Aggravating factors: back/hip: increased use   knee:  kneeling, twisting.  Neck:  sitting prolonged/sleeping Relieving factors: massage to knots, stretching in general  PRECAUTIONS: None  WEIGHT BEARING RESTRICTIONS: No  FALLS:  Has patient fallen in last 6 months? No  LIVING ENVIRONMENT: Lives in: House/apartment  OCCUPATION: Programmer with sitting desk  PLOF: Independent, Rt hand dominant, has gym access but limited use so far.   PATIENT GOALS: Improve flexibility, reduce muscle tightness/pain.    OBJECTIVE:   DIAGNOSTIC FINDINGS:  Imaging: XRs of the lumbar spine from 09/11/2023 were independently reviewed and interpreted, showing disc height loss at L3/4 and L4/5. Anterior osteophyte formation seen at T11/12, T12/L1, and L3/4. No other significant degenerative changes seen. No evidence of instability on flexion/extension views. No fracture or dislocation seen.   PATIENT SURVEYS:  Patient-Specific Activity Scoring Scheme  "0" represents "unable to perform." "10" represents "able to perform at prior level. 0 1 2 3 4 5 6 7 8 9  10 (Date and Score)   Activity 10/30/2023    1. Exercise without knee pain 7/10     2. Sleeping  2/10    3.     4.    5.    Score 4.5    Total score = sum of the activity scores/number of activities Minimum detectable change (90%CI) for average score = 2 points Minimum detectable change (90%CI) for single activity score = 3 points  SCREENING FOR RED FLAGS: 10/30/2023 Bowel or bladder incontinence: No Cauda equina syndrome: No  COGNITION: 10/30/2023 Overall cognitive status: WFL  normal      SENSATION: 10/30/2023 WFL  MUSCLE LENGTH: 10/30/2023 Passive SLR Lt and Rt to 90 deg bilateral.  Ely's (+) bilateral   POSTURE:  10/30/2023 Reduced lumbar lordosis, increased lower thoracic kyphosis with reduced curvature in mid and upper thoracic region.  Rt shoulders mild to Rt compared to hips in standing.   PALPATION: 10/30/2023 No specific tenderness to touch today in lower back/hip.  Trigger points noted in bilateral upper trap with local concordant symptoms.   LUMBAR ROM:  10/30/2023 Directional Preference Assessment: Centralization: Peripheralization:   AROM 10/30/2023  Flexion To toes, similar tightness bilateral thighs  Extension 75 % WFL with   Right lateral flexion   Left lateral flexion   Right rotation   Left rotation    (Blank rows = not tested)  LOWER EXTREMITY ROM:      Right 10/30/2023 Left 10/30/2023 Right 11/05/2023 Left 11/05/2023  Hip flexion      Hip extension      Hip abduction      Hip adduction      Hip internal rotation 42 PROM in 90 deg hip flexion supine 30  PROM in 90 deg hip flexion supine 43 PROM in 90 deg hip flexion supine 46 PROM in 90 deg hip flexion supine  Hip external rotation      Knee flexion      Knee extension      Ankle dorsiflexion      Ankle plantarflexion      Ankle inversion      Ankle eversion       (Blank rows = not tested)  LOWER EXTREMITY MMT:    MMT Right 10/30/2023 Left 10/30/2023  Hip flexion 5/5 5/5  Hip extension    Hip abduction    Hip adduction    Hip internal rotation    Hip external rotation    Knee flexion 5/5 5/5  Knee extension 5/5 108 lbs 5/5 112 lbs   Ankle dorsiflexion 5/5 5/5  Ankle plantarflexion  Ankle inversion    Ankle eversion     (Blank rows = not tested)  LUMBAR SPECIAL TESTS:  10/30/2023 (-) slump bilateral, (-) slump testing bilateral (+) ely's test for quad tightness bilateral  FUNCTIONAL TESTS:  10/30/2023 Functional squat with Lt knee anterior  18 inch  chair s UE assist on 1 try.   GAIT: 10/30/2023 WFL related to LE control                                                                                                                                                                                                                   TODAY'S TREATMENT:                                                                                                         DATE: 11/05/2023  Trigger Point Dry Needling Initial Treatment: Pt instructed on Dry Needling rational, procedures, and possible side effects. Pt instructed to expect mild to moderate muscle soreness later in the day and/or into the next day.  Pt instructed in methods to reduce muscle soreness. Pt instructed to continue prescribed HEP. Patient verbalized understanding of these instructions and education.  Patient Verbal Consent Given: Yes Education Handout Provided: Previously Provided Muscles treated: Rt upper rap  Treatment response/outcome: local twitch response   Therex: Rt upper trap self stretch 15 sec x 5 with moist heat Sidelying Lt hip clam shell green band x 15 with review for home Sidelying Lt reverse clam shell x 15 Supine bridge c green band around knees for hip abduction hold Lateral step down eccentric control 2 x 15 bilateral 6 inch step   HEP review c verbal cues for activity  Neuro Re-ed (postural awareness, recruitment) Tband rows c scapular retraction 2 x 15 green band Tband GH ext 2 x 15 green band Scapular retraction review, review of cervical retraction exercise.    Self Care Education verbally on post manual and needling soreness possibility and effective strategy to help address and improve following visit.  Strategies included but not limited to:  heat/ice prn, increased  water intake, use of HEP and general mobility to move muscle soreness out.  Pt voiced understanding.  Performed with moist heat   TODAY'S TREATMENT:                                                                                                          DATE: 10/30/2023  Therex:    HEP instruction/performance c cues for techniques, handout provided.  Trial set performed of each for comprehension and symptom assessment.  See below for exercise list.  Verbal cues and encouragement for routine use of postural mobility/activation in midst of work activity to promote mobility during day.   PATIENT EDUCATION:  10/30/2023 Education details: HEP, POC Person educated: Patient Education method: Programmer, multimedia, Demonstration, Verbal cues, and Handouts Education comprehension: verbalized understanding, returned demonstration, and verbal cues required  HOME EXERCISE PROGRAM: Access Code: [facility] URL: [email or web address] Date: 10/30/2023 Prepared by: Chyrel Masson  Exercises - Seated Scapular Retraction  - 3-5 x daily - 7 x weekly - 1 sets - 5-10 reps - 3-5 hold - Standing Lumbar Extension with Counter  - 3-5 x daily - 7 x weekly - 1 sets - 5-10 reps - Seated Upper Trapezius Stretch  - 2 x daily - 7 x weekly - 1 sets - 3-5 reps - 15-30 hold - Cervical Retraction at Wall  - 2 x daily - 7 x weekly - 1 sets - 10 reps - 5 hold - Seated Straight Leg Heel Taps  - 1-2 x daily - 7 x weekly - 2-3 sets - 10-15 reps - Supine Bridge  - 1-2 x daily - 7 x weekly - 1-2 sets - 10 reps - 2 hold - Sidelying Reverse Clamshell  - 1-2 x daily - 7 x weekly - 2-3 sets - 10 reps - Clamshell  - 1-2 x daily - 7 x weekly - 2-3 sets - 10 reps  ASSESSMENT:  CLINICAL IMPRESSION: Positive response with concordant symptoms noted in needling to Rt upper trap.  Lt hip IR mobility showed good gains compared to evaluation.  No reproduced knee pain noted in step down.  Continued skilled PT services indicated at this time.    OBJECTIVE IMPAIRMENTS: decreased activity tolerance, decreased balance, decreased coordination, decreased endurance, decreased mobility, difficulty walking, decreased ROM, decreased  strength, increased fascial restrictions, impaired perceived functional ability, increased muscle spasms, impaired flexibility, improper body mechanics, postural dysfunction, and pain.   ACTIVITY LIMITATIONS: bending, sitting, standing, squatting, and sleeping  PARTICIPATION LIMITATIONS: interpersonal relationship, community activity, and occupation  PERSONAL FACTORS: Time since onset of injury/illness/exacerbation and 3+ comorbidities: multiple treatment areas noted, previous chronic spinal conditions including scoliosis  are also affecting patient's functional outcome.   REHAB POTENTIAL: Good  CLINICAL DECISION MAKING: Evolving/moderate complexity  EVALUATION COMPLEXITY: Moderate   GOALS: Goals reviewed with patient? Yes  SHORT TERM GOALS: (target date for Short term goals are 3 weeks 11/20/2023)  1. Patient will demonstrate independent use of home exercise program to maintain progress from in clinic treatments.  Goal status: on going 11/05/2023  LONG TERM GOALS: (target  dates for all long term goals are 10 weeks  01/08/2024 )   1. Patient will demonstrate/report pain at worst less than or equal to 2/10 to facilitate minimal limitation in daily activity secondary to pain symptoms.  Goal status: New   2. Patient will demonstrate independent use of home exercise program to facilitate ability to maintain/progress functional gains from skilled physical therapy services.  Goal status: New   3. Patient will demonstrate Patient specific functional scale avg > or = 8/10 to indicate reduced disability due to condition.   Goal status: New   4. Patient will demonstrate lumbar extension 100 % WFL s symptoms to facilitate upright standing, walking posture at PLOF s limitation.  Goal status: New   5.  Patient will demonstrate bilateral hip IR mobility > 45 deg to facilitate usual mobility at PLOF.   Goal status: New   6.  Patient will demonstrate/report ability to sleep s restriction due  to pain symptoms.  Goal status: New    PLAN:  PT FREQUENCY: 1-2x/week  PT DURATION: 10 weeks  PLANNED INTERVENTIONS: Can include 40981- PT Re-evaluation, 97110-Therapeutic exercises, 97530- Therapeutic activity, 97112- Neuromuscular re-education, [phone]- Self Care, 97140- Manual therapy, [phone]- Gait training, [phone]- Orthotic Fit/training, [phone]- Canalith repositioning, [id number]- Aquatic Therapy, [phone]- Electrical stimulation (unattended), 97750 Physical performance testing, [id number]- Electrical stimulation (manual), 97016- Vasopneumatic device, [id number]- Ultrasound, [id number]- Traction (mechanical), [id number]- Ionotophoresis 4mg /ml Dexamethasone, Patient/Family education, Balance training, Stair training, Taping, Dry Needling, Joint mobilization, Joint manipulation, Spinal manipulation, Spinal mobilization, Scar mobilization, Vestibular training, Visual/preceptual remediation/compensation, DME instructions, Cryotherapy, and Moist heat.  All performed as medically necessary.  All included unless contraindicated  PLAN FOR NEXT SESSION:  Dry needling as desired.  Leg press trial, progress HEP as able for strengthening.    Chyrel Masson, PT, DPT, OCS, ATC 11/05/23  8:54 AM

## 2023-11-06 ENCOUNTER — Ambulatory Visit: Payer: BLUE CROSS/BLUE SHIELD | Admitting: Orthopedic Surgery

## 2023-11-11 ENCOUNTER — Encounter: Payer: Self-pay | Admitting: Physical Therapy

## 2023-11-11 ENCOUNTER — Ambulatory Visit: Admitting: Physical Therapy

## 2023-11-11 DIAGNOSIS — M6281 Muscle weakness (generalized): Secondary | ICD-10-CM

## 2023-11-11 DIAGNOSIS — M25552 Pain in left hip: Secondary | ICD-10-CM

## 2023-11-11 DIAGNOSIS — R293 Abnormal posture: Secondary | ICD-10-CM | POA: Diagnosis not present

## 2023-11-11 DIAGNOSIS — M25562 Pain in left knee: Secondary | ICD-10-CM | POA: Diagnosis not present

## 2023-11-11 DIAGNOSIS — G8929 Other chronic pain: Secondary | ICD-10-CM

## 2023-11-11 DIAGNOSIS — M5459 Other low back pain: Secondary | ICD-10-CM

## 2023-11-11 DIAGNOSIS — M542 Cervicalgia: Secondary | ICD-10-CM

## 2023-11-11 NOTE — Therapy (Signed)
 OUTPATIENT PHYSICAL THERAPY TREATMENT   Patient Name: Christian Hawkins MRN: [id number] DOB:1975/09/26, 48 y.o., male Today's Date: 11/11/2023  END OF SESSION:  PT End of Session - 11/11/23 0857     Visit Number 3    Number of Visits 20    Date for PT Re-Evaluation 01/08/24    Authorization Type [employer] $35 copay , 20 PT visits    Progress Note Due on Visit 10    PT Start Time 0855    PT Stop Time 0925    PT Time Calculation (min) 30 min    Activity Tolerance Patient tolerated treatment well    Behavior During Therapy WFL for tasks assessed/performed               Past Medical History:  Diagnosis Date   Alcohol abuse    quit 4 years ago   Past Surgical History:  Procedure Laterality Date   EYE SURGERY     There are no active problems to display for this patient.   PCP: Lind Covert MD  REFERRING PROVIDER: London Sheer, MD  REFERRING DIAG: M54.50 (ICD-10-CM) - Low back pain, unspecified back pain laterality, unspecified chronicity, unspecified whether sciatica present  Rationale for Evaluation and Treatment: Rehabilitation  THERAPY DIAG:  Other low back pain  Pain in left hip  Chronic pain of left knee  Abnormal posture  Cervicalgia  Muscle weakness (generalized)  ONSET DATE: Over last year 2024  SUBJECTIVE:                                                                                                                                                                                           SUBJECTIVE STATEMENT: Back hasn't had many issues lately; exercises and stretches are helping.    PERTINENT HISTORY:  Per MD visit:  Patient states over the last year he has noticed decreased range of motion in his lumbar spine and his left hip. He has not had any pain radiating into either of his lower extremities. He does not have any pain in his lower back. He has been trying some home stretches but has not noticed any significant improvement. He has no  family history of ankylosing spondylitis. He has had no posterior hip pain recently or within the last year or so.   PAIN:  NPRS scale: at current:   neck: 0; only tightness/10, back: not noted  Lt hip 0/10,  knee: 0/10 at rest.  Pain location: back, Lt hip, neck bilateral Pain description: achy, pressure , spasms Aggravating factors: back/hip: increased use   knee:  kneeling, twisting.  Neck:  sitting prolonged/sleeping Relieving factors:  massage to knots, stretching in general  PRECAUTIONS: None  WEIGHT BEARING RESTRICTIONS: No  FALLS:  Has patient fallen in last 6 months? No  LIVING ENVIRONMENT: Lives in: House/apartment  OCCUPATION: Programmer with sitting desk  PLOF: Independent, Rt hand dominant, has gym access but limited use so far.   PATIENT GOALS: Improve flexibility, reduce muscle tightness/pain.    OBJECTIVE:   DIAGNOSTIC FINDINGS:  Imaging: XRs of the lumbar spine from 09/11/2023 were independently reviewed and interpreted, showing disc height loss at L3/4 and L4/5. Anterior osteophyte formation seen at T11/12, T12/L1, and L3/4. No other significant degenerative changes seen. No evidence of instability on flexion/extension views. No fracture or dislocation seen.   PATIENT SURVEYS:  Patient-Specific Activity Scoring Scheme  "0" represents "unable to perform." "10" represents "able to perform at prior level. 0 1 2 3 4 5 6 7 8 9  10 (Date and Score)   Activity 10/30/2023    1. Exercise without knee pain 7/10     2. Sleeping  2/10    3.     4.    5.    Score 4.5    Total score = sum of the activity scores/number of activities Minimum detectable change (90%CI) for average score = 2 points Minimum detectable change (90%CI) for single activity score = 3 points  SCREENING FOR RED FLAGS: 10/30/2023 Bowel or bladder incontinence: No Cauda equina syndrome: No  COGNITION: 10/30/2023 Overall cognitive status: WFL normal      SENSATION: 10/30/2023 WFL  MUSCLE  LENGTH: 10/30/2023 Passive SLR Lt and Rt to 90 deg bilateral.  Ely's (+) bilateral   POSTURE:  10/30/2023 Reduced lumbar lordosis, increased lower thoracic kyphosis with reduced curvature in mid and upper thoracic region.  Rt shoulders mild to Rt compared to hips in standing.   PALPATION: 10/30/2023 No specific tenderness to touch today in lower back/hip.  Trigger points noted in bilateral upper trap with local concordant symptoms.   LUMBAR ROM:  10/30/2023 Directional Preference Assessment: Centralization: Peripheralization:   AROM 10/30/2023  Flexion To toes, similar tightness bilateral thighs  Extension 75 % WFL with   Right lateral flexion   Left lateral flexion   Right rotation   Left rotation    (Blank rows = not tested)  LOWER EXTREMITY ROM:      Right 10/30/2023 Left 10/30/2023 Right 11/05/2023 Left 11/05/2023  Hip internal rotation 42 PROM in 90 deg hip flexion supine 30  PROM in 90 deg hip flexion supine 43 PROM in 90 deg hip flexion supine 46 PROM in 90 deg hip flexion supine   (Blank rows = not tested)  LOWER EXTREMITY MMT:    MMT Right 10/30/2023 Left 10/30/2023  Hip flexion 5/5 5/5  Hip extension    Hip abduction    Hip adduction    Hip internal rotation    Hip external rotation    Knee flexion 5/5 5/5  Knee extension 5/5 108 lbs 5/5 112 lbs   Ankle dorsiflexion 5/5 5/5  Ankle plantarflexion    Ankle inversion    Ankle eversion     (Blank rows = not tested)  LUMBAR SPECIAL TESTS:  10/30/2023 (-) slump bilateral, (-) slump testing bilateral (+) ely's test for quad tightness bilateral  FUNCTIONAL TESTS:  10/30/2023 Functional squat with Lt knee anterior  18 inch chair s UE assist on 1 try.   GAIT: 10/30/2023 WFL related to LE control  TODAY'S  TREATMENT DATE:  11/11/23 Manual STM with compression to Rt upper trap; skilled palpation and monitoring of soft tissue during DN Discussed use of percussive device at home for self IASTM as pt has one at home  Trigger Point Dry Needling  Subsequent Treatment: Instructions provided previously at initial dry needling treatment.   Patient Verbal Consent Given: Yes Education Handout Provided: Previously Provided Muscles Treated: Rt upper trap Electrical Stimulation Performed: No Treatment Response/Outcome: twitch responses noted; with decreased tightness noted following   TherEx Upper trap stretch 3x30 sec bil Levator stretch 3x30 sec bil Seated ER with scapular retraction 2x10; L3 band Seated horizontal abduction 2x10; L3 band      11/05/2023  Trigger Point Dry Needling Initial Treatment: Pt instructed on Dry Needling rational, procedures, and possible side effects. Pt instructed to expect mild to moderate muscle soreness later in the day and/or into the next day.  Pt instructed in methods to reduce muscle soreness. Pt instructed to continue prescribed HEP. Patient verbalized understanding of these instructions and education.  Patient Verbal Consent Given: Yes Education Handout Provided: Previously Provided Muscles treated: Rt upper rap  Treatment response/outcome: local twitch response   Therex: Rt upper trap self stretch 15 sec x 5 with moist heat Sidelying Lt hip clam shell green band x 15 with review for home Sidelying Lt reverse clam shell x 15 Supine bridge c green band around knees for hip abduction hold Lateral step down eccentric control 2 x 15 bilateral 6 inch step   HEP review c verbal cues for activity  Neuro Re-ed (postural awareness, recruitment) Tband rows c scapular retraction 2 x 15 green band Tband GH ext 2 x 15 green band Scapular retraction review, review of cervical retraction exercise.    Self Care Education verbally on post manual and  needling soreness possibility and effective strategy to help address and improve following visit.  Strategies included but not limited to:  heat/ice prn, increased water intake, use of HEP and general mobility to move muscle soreness out.  Pt voiced understanding.  Performed with moist heat   10/30/2023  Therex:    HEP instruction/performance c cues for techniques, handout provided.  Trial set performed of each for comprehension and symptom assessment.  See below for exercise list.  Verbal cues and encouragement for routine use of postural mobility/activation in midst of work activity to promote mobility during day.   PATIENT EDUCATION:  10/30/2023 Education details: HEP, POC Person educated: Patient Education method: Programmer, multimedia, Demonstration, Verbal cues, and Handouts Education comprehension: verbalized understanding, returned demonstration, and verbal cues required  HOME EXERCISE PROGRAM: Access Code: [facility] URL: [email or web address] Date: 11/11/2023 Prepared by: Moshe Cipro  Exercises - Seated Scapular Retraction  - 3-5 x daily - 7 x weekly - 1 sets - 5-10 reps - 3-5 hold - Standing Lumbar Extension with Counter  - 3-5 x daily - 7 x weekly - 1 sets - 5-10 reps - Seated Upper Trapezius Stretch  - 2 x daily - 7 x weekly - 1 sets - 3-5 reps - 15-30 hold - Cervical Retraction at Wall  - 2 x daily - 7 x weekly - 1 sets - 10 reps - 5 hold - Seated Straight Leg Heel Taps  - 1-2 x daily - 7 x weekly - 2-3 sets - 10-15 reps - Supine Bridge  - 1-2 x daily - 7 x weekly - 1-2 sets - 10 reps - 2 hold - Sidelying Reverse Clamshell  - 1-2 x daily -  7 x weekly - 2-3 sets - 10 reps - Clamshell  - 1-2 x daily - 7 x weekly - 2-3 sets - 10 reps - Seated Levator Scapulae Stretch (Mirrored)  - 1-2 x daily - 7 x weekly - 1 sets - 2-3 reps - 30 sec hold  ASSESSMENT:  CLINICAL IMPRESSION: Pt tolerated session well today with good response to DN and manual therapy to Rt upper  trap.  Overall progressing well and possibly ready for hold at next visit.    OBJECTIVE IMPAIRMENTS: decreased activity tolerance, decreased balance, decreased coordination, decreased endurance, decreased mobility, difficulty walking, decreased ROM, decreased strength, increased fascial restrictions, impaired perceived functional ability, increased muscle spasms, impaired flexibility, improper body mechanics, postural dysfunction, and pain.   ACTIVITY LIMITATIONS: bending, sitting, standing, squatting, and sleeping  PARTICIPATION LIMITATIONS: interpersonal relationship, community activity, and occupation  PERSONAL FACTORS: Time since onset of injury/illness/exacerbation and 3+ comorbidities: multiple treatment areas noted, previous chronic spinal conditions including scoliosis  are also affecting patient's functional outcome.   REHAB POTENTIAL: Good  CLINICAL DECISION MAKING: Evolving/moderate complexity  EVALUATION COMPLEXITY: Moderate   GOALS: Goals reviewed with patient? Yes  SHORT TERM GOALS: (target date for Short term goals are 3 weeks 11/20/2023)  1. Patient will demonstrate independent use of home exercise program to maintain progress from in clinic treatments.  Goal status: on going 11/05/2023  LONG TERM GOALS: (target dates for all long term goals are 10 weeks  01/08/2024 )   1. Patient will demonstrate/report pain at worst less than or equal to 2/10 to facilitate minimal limitation in daily activity secondary to pain symptoms.  Goal status: New   2. Patient will demonstrate independent use of home exercise program to facilitate ability to maintain/progress functional gains from skilled physical therapy services.  Goal status: New   3. Patient will demonstrate Patient specific functional scale avg > or = 8/10 to indicate reduced disability due to condition.   Goal status: New   4. Patient will demonstrate lumbar extension 100 % WFL s symptoms to facilitate upright  standing, walking posture at PLOF s limitation.  Goal status: New   5.  Patient will demonstrate bilateral hip IR mobility > 45 deg to facilitate usual mobility at PLOF.   Goal status: New   6.  Patient will demonstrate/report ability to sleep s restriction due to pain symptoms.  Goal status: New    PLAN:  PT FREQUENCY: 1-2x/week  PT DURATION: 10 weeks  PLANNED INTERVENTIONS: Can include 16109- PT Re-evaluation, 97110-Therapeutic exercises, 97530- Therapeutic activity, 97112- Neuromuscular re-education, [phone]- Self Care, 97140- Manual therapy, [phone]- Gait training, [phone]- Orthotic Fit/training, [phone]- Canalith repositioning, [id number]- Aquatic Therapy, [phone]- Electrical stimulation (unattended), 97750 Physical performance testing, [id number]- Electrical stimulation (manual), 97016- Vasopneumatic device, [id number]- Ultrasound, [id number]- Traction (mechanical), [id number]- Ionotophoresis 4mg /ml Dexamethasone, Patient/Family education, Balance training, Stair training, Taping, Dry Needling, Joint mobilization, Joint manipulation, Spinal manipulation, Spinal mobilization, Scar mobilization, Vestibular training, Visual/preceptual remediation/compensation, DME instructions, Cryotherapy, and Moist heat.  All performed as medically necessary.  All included unless contraindicated  PLAN FOR NEXT SESSION:  reassess, possibly hold PT if he's doing well; Leg press trial, progress HEP as able for strengthening.    Clarita Crane, PT, DPT 11/11/23 9:27 AM

## 2023-11-28 ENCOUNTER — Encounter: Payer: Self-pay | Admitting: Rehabilitative and Restorative Service Providers"

## 2023-11-28 ENCOUNTER — Ambulatory Visit: Admitting: Rehabilitative and Restorative Service Providers"

## 2023-11-28 DIAGNOSIS — G8929 Other chronic pain: Secondary | ICD-10-CM

## 2023-11-28 DIAGNOSIS — M542 Cervicalgia: Secondary | ICD-10-CM

## 2023-11-28 DIAGNOSIS — M6281 Muscle weakness (generalized): Secondary | ICD-10-CM

## 2023-11-28 DIAGNOSIS — M25552 Pain in left hip: Secondary | ICD-10-CM

## 2023-11-28 DIAGNOSIS — M5459 Other low back pain: Secondary | ICD-10-CM

## 2023-11-28 DIAGNOSIS — M25562 Pain in left knee: Secondary | ICD-10-CM

## 2023-11-28 DIAGNOSIS — R293 Abnormal posture: Secondary | ICD-10-CM | POA: Diagnosis not present

## 2023-11-28 NOTE — Therapy (Addendum)
 OUTPATIENT PHYSICAL THERAPY TREATMENT / DISCHARGE   Patient Name: Christian Hawkins MRN: [id number] DOB:1975-10-01, 48 y.o., male Today's Date: 11/28/2023  END OF SESSION:  PT End of Session - 11/28/23 0928     Visit Number 4    Number of Visits 20    Date for PT Re-Evaluation 01/08/24    Authorization Type [employer] $35 copay , 20 PT visits    Progress Note Due on Visit 10    PT Start Time 0927    PT Stop Time 1008    PT Time Calculation (min) 41 min    Activity Tolerance Patient tolerated treatment well    Behavior During Therapy WFL for tasks assessed/performed                Past Medical History:  Diagnosis Date   Alcohol abuse    quit 4 years ago   Past Surgical History:  Procedure Laterality Date   EYE SURGERY     There are no active problems to display for this patient.   PCP: Claudene Lacks MD  REFERRING PROVIDER: Georgina Ozell LABOR, MD  REFERRING DIAG: M54.50 (ICD-10-CM) - Low back pain, unspecified back pain laterality, unspecified chronicity, unspecified whether sciatica present  Rationale for Evaluation and Treatment: Rehabilitation  THERAPY DIAG:  Other low back pain  Pain in left hip  Chronic pain of left knee  Abnormal posture  Cervicalgia  Muscle weakness (generalized)  ONSET DATE: Over last year 2024  SUBJECTIVE:                                                                                                                                                                                           SUBJECTIVE STATEMENT: Pt indicated hip and knee has shown improvement to this point.  Pt indicated feeling more secure and stronger in shoulders.    PERTINENT HISTORY:  Per MD visit:  Patient states over the last year he has noticed decreased range of motion in his lumbar spine and his left hip. He has not had any pain radiating into either of his lower extremities. He does not have any pain in his lower back. He has been trying some home  stretches but has not noticed any significant improvement. He has no family history of ankylosing spondylitis. He has had no posterior hip pain recently or within the last year or so.   PAIN:  NPRS scale: no specific pain upon arrival, tightness.  Pain location: back, Lt hip, neck bilateral Pain description: achy, pressure , spasms Aggravating factors: back/hip: increased use   knee:  kneeling, twisting.  Neck:  sitting prolonged/sleeping Relieving factors: massage  to knots, stretching in general  PRECAUTIONS: None  WEIGHT BEARING RESTRICTIONS: No  FALLS:  Has patient fallen in last 6 months? No  LIVING ENVIRONMENT: Lives in: House/apartment  OCCUPATION: Programmer with sitting desk  PLOF: Independent, Rt hand dominant, has gym access but limited use so far.   PATIENT GOALS: Improve flexibility, reduce muscle tightness/pain.    OBJECTIVE:   DIAGNOSTIC FINDINGS:  Imaging: XRs of the lumbar spine from 09/11/2023 were independently reviewed and interpreted, showing disc height loss at L3/4 and L4/5. Anterior osteophyte formation seen at T11/12, T12/L1, and L3/4. No other significant degenerative changes seen. No evidence of instability on flexion/extension views. No fracture or dislocation seen.   PATIENT SURVEYS:  Patient-Specific Activity Scoring Scheme  0 represents "unable to perform." 10 represents "able to perform at prior level. 0 1 2 3 4 5 6 7 8 9  10 (Date and Score)   Activity 10/30/2023 11/28/2023   1. Exercise without knee pain 7/10   8  2. Sleeping  2/10  9  3.     4.    5.    Score 4.5 8.5 avg    Total score = sum of the activity scores/number of activities Minimum detectable change (90%CI) for average score = 2 points Minimum detectable change (90%CI) for single activity score = 3 points  SCREENING FOR RED FLAGS: 10/30/2023 Bowel or bladder incontinence: No Cauda equina syndrome: No  COGNITION: 10/30/2023 Overall cognitive status: WFL  normal      SENSATION: 10/30/2023 WFL  MUSCLE LENGTH: 10/30/2023 Passive SLR Lt and Rt to 90 deg bilateral.  Ely's (+) bilateral   POSTURE:  10/30/2023 Reduced lumbar lordosis, increased lower thoracic kyphosis with reduced curvature in mid and upper thoracic region.  Rt shoulders mild to Rt compared to hips in standing.   PALPATION: 10/30/2023 No specific tenderness to touch today in lower back/hip.  Trigger points noted in bilateral upper trap with local concordant symptoms.   LUMBAR ROM:  10/30/2023 Directional Preference Assessment: Centralization: Peripheralization:   AROM 10/30/2023 11/28/2023  Flexion To toes, similar tightness bilateral thighs To floor with out complaints  Extension 75 % WFL with  100% WFL  Right lateral flexion    Left lateral flexion    Right rotation    Left rotation     (Blank rows = not tested)  LOWER EXTREMITY ROM:      Right 10/30/2023 Left 10/30/2023 Right 11/05/2023 Left 11/05/2023  Hip internal rotation 42 PROM in 90 deg hip flexion supine 30  PROM in 90 deg hip flexion supine 43 PROM in 90 deg hip flexion supine 46 PROM in 90 deg hip flexion supine   (Blank rows = not tested)  LOWER EXTREMITY MMT:    MMT Right 10/30/2023 Left 10/30/2023 Left 11/28/2023  Hip flexion 5/5 5/5   Hip extension     Hip abduction     Hip adduction     Hip internal rotation     Hip external rotation     Knee flexion 5/5 5/5   Knee extension 5/5 108 lbs 5/5 112 lbs  5/5 118 lbs  Ankle dorsiflexion 5/5 5/5   Ankle plantarflexion     Ankle inversion     Ankle eversion      (Blank rows = not tested)  LUMBAR SPECIAL TESTS:  10/30/2023 (-) slump bilateral, (-) slump testing bilateral (+) ely's test for quad tightness bilateral  FUNCTIONAL TESTS:  10/30/2023 Functional squat with Lt knee anterior  18 inch chair s  UE assist on 1 try.   GAIT: 10/30/2023 WFL related to LE control                                                                                                                                                                                                                    TODAY'S TREATMENT        DATE: 11/28/2023 Therex: Review of existing HEP c verbal cues for techniques Seated Rt upper trap stretch 15 sec x 3 Standing doorway hip hike 3-5 sec hold x 10 bilateral  UBE fwd/back 3 mins each way with 1 min rest between direction.  Lvl 3.0  Prone arm/leg lift 3 sec hold x 10   Manual: Percussive device to Rt upper trap for myofascial tightness  Trigger Point Dry Needling Subsequent Treatment: Instructions provided previously at initial dry needling treatment.  Patient Verbal Consent Given: Yes Education Handout Provided: Previously Provided Muscles treated: Rt upper trap Treatment response/outcome: local twitch response    TODAY'S TREATMENT        DATE: 11/11/23 Manual STM with compression to Rt upper trap; skilled palpation and monitoring of soft tissue during DN Discussed use of percussive device at home for self IASTM as pt has one at home  Trigger Point Dry Needling  Subsequent Treatment: Instructions provided previously at initial dry needling treatment.   Patient Verbal Consent Given: Yes Education Handout Provided: Previously Provided Muscles Treated: Rt upper trap Electrical Stimulation Performed: No Treatment Response/Outcome: twitch responses noted; with decreased tightness noted following   TherEx Upper trap stretch 3x30 sec bil Levator stretch 3x30 sec bil Seated ER with scapular retraction 2x10; L3 band Seated horizontal abduction 2x10; L3 band      TODAY'S TREATMENT        DATE: 11/05/2023  Trigger Point Dry Needling Initial Treatment: Pt instructed on Dry Needling rational, procedures, and possible side effects. Pt instructed to expect mild to moderate muscle soreness later in the day and/or into the next day.  Pt instructed in methods to reduce muscle soreness. Pt instructed to continue  prescribed HEP. Patient verbalized understanding of these instructions and education.  Patient Verbal Consent Given: Yes Education Handout Provided: Previously Provided Muscles treated: Rt upper rap  Treatment response/outcome: local twitch response   Therex: Rt upper trap self stretch 15 sec x 5 with moist heat Sidelying Lt hip clam shell green band x 15 with review for home Sidelying Lt reverse clam shell x 15 Supine bridge c green band around knees  for hip abduction hold Lateral step down eccentric control 2 x 15 bilateral 6 inch step   HEP review c verbal cues for activity  Neuro Re-ed (postural awareness, recruitment) Tband rows c scapular retraction 2 x 15 green band Tband GH ext 2 x 15 green band Scapular retraction review, review of cervical retraction exercise.    Self Care Education verbally on post manual and needling soreness possibility and effective strategy to help address and improve following visit.  Strategies included but not limited to:  heat/ice prn, increased water intake, use of HEP and general mobility to move muscle soreness out.  Pt voiced understanding.  Performed with moist heat   TODAY'S TREATMENT        DATE: 10/30/2023  Therex:    HEP instruction/performance c cues for techniques, handout provided.  Trial set performed of each for comprehension and symptom assessment.  See below for exercise list.  Verbal cues and encouragement for routine use of postural mobility/activation in midst of work activity to promote mobility during day.   PATIENT EDUCATION:  11/28/2023 Education details: HEP update Person educated: Patient Education method: Programmer, multimedia, Demonstration, Verbal cues, and Handouts Education comprehension: verbalized understanding, returned demonstration, and verbal cues required  HOME EXERCISE PROGRAM: Access Code: [facility] URL: [email or web address] Date: 11/28/2023 Prepared by: Ozell Silvan  Exercises - Seated  Scapular Retraction  - 3-5 x daily - 7 x weekly - 1 sets - 5-10 reps - 3-5 hold - Standing Lumbar Extension with Counter  - 3-5 x daily - 7 x weekly - 1 sets - 5-10 reps - Seated Upper Trapezius Stretch  - 2 x daily - 7 x weekly - 1 sets - 3-5 reps - 15-30 hold - Cervical Retraction at Wall  - 2 x daily - 7 x weekly - 1 sets - 10 reps - 5 hold - Seated Straight Leg Heel Taps  - 1-2 x daily - 7 x weekly - 2-3 sets - 10-15 reps - Supine Bridge  - 1-2 x daily - 7 x weekly - 1-2 sets - 10 reps - 2 hold - Sidelying Reverse Clamshell  - 1-2 x daily - 7 x weekly - 2-3 sets - 10 reps - Clamshell  - 1-2 x daily - 7 x weekly - 2-3 sets - 10 reps - Standing Hip Hiking  - 1-2 x daily - 7 x weekly - 1 sets - 10 reps - 5 hold - Prone Alternating Arm and Leg Lifts  - 1-2 x daily - 7 x weekly - 1-2 sets - 10 reps - 3 hold  ASSESSMENT:  CLINICAL IMPRESSION: The patient has attended 4 visits over the course of treatment cycle.  Patient has reported overall improvement at 80% to normal.  See objective data above for updated information regarding current presentation.  Good knowledge of HEP at this time and good adherence to plan.  PSFS notably better.  Due to improvements, recommend trial HEP at this time.  Pt was in agreement.  May return in future.     OBJECTIVE IMPAIRMENTS: decreased activity tolerance, decreased balance, decreased coordination, decreased endurance, decreased mobility, difficulty walking, decreased ROM, decreased strength, increased fascial restrictions, impaired perceived functional ability, increased muscle spasms, impaired flexibility, improper body mechanics, postural dysfunction, and pain.   ACTIVITY LIMITATIONS: bending, sitting, standing, squatting, and sleeping  PARTICIPATION LIMITATIONS: interpersonal relationship, community activity, and occupation  PERSONAL FACTORS: Time since onset of injury/illness/exacerbation and 3+ comorbidities: multiple treatment areas noted, previous  chronic spinal conditions including scoliosis are  also affecting patient's functional outcome.   REHAB POTENTIAL: Good  CLINICAL DECISION MAKING: Evolving/moderate complexity  EVALUATION COMPLEXITY: Moderate   GOALS: Goals reviewed with patient? Yes  SHORT TERM GOALS: (target date for Short term goals are 3 weeks 11/20/2023)  1. Patient will demonstrate independent use of home exercise program to maintain progress from in clinic treatments.  Goal status: Met   LONG TERM GOALS: (target dates for all long term goals are 10 weeks  01/08/2024 )   1. Patient will demonstrate/report pain at worst less than or equal to 2/10 to facilitate minimal limitation in daily activity secondary to pain symptoms.  Goal status: Met 11/28/2023   2. Patient will demonstrate independent use of home exercise program to facilitate ability to maintain/progress functional gains from skilled physical therapy services.  Goal status:  Met 11/28/2023   3. Patient will demonstrate Patient specific functional scale avg > or = 8/10 to indicate reduced disability due to condition.   Goal status:  Met 11/28/2023   4. Patient will demonstrate lumbar extension 100 % WFL s symptoms to facilitate upright standing, walking posture at PLOF s limitation.  Goal status:  Met 11/28/2023   5.  Patient will demonstrate bilateral hip IR mobility > 45 deg to facilitate usual mobility at PLOF.   Goal status:  Met 11/28/2023   6.  Patient will demonstrate/report ability to sleep s restriction due to pain symptoms.  Goal status:  Met 11/28/2023    PLAN:  PT FREQUENCY: 1-2x/week  PT DURATION: 10 weeks  PLANNED INTERVENTIONS: Can include 02853- PT Re-evaluation, 97110-Therapeutic exercises, 97530- Therapeutic activity, 97112- Neuromuscular re-education, 97535- Self Care, 97140- Manual therapy, [phone]- Gait training, [phone]- Orthotic Fit/training, [phone]- Canalith repositioning, [id number]- Aquatic Therapy, [phone]- Electrical stimulation  (unattended), 97750 Physical performance testing, [id number]- Electrical stimulation (manual), 97016- Vasopneumatic device, [id number]- Ultrasound, [id number]- Traction (mechanical), [id number]- Ionotophoresis 4mg /ml Dexamethasone, Patient/Family education, Balance training, Stair training, Taping, Dry Needling, Joint mobilization, Joint manipulation, Spinal manipulation, Spinal mobilization, Scar mobilization, Vestibular training, Visual/preceptual remediation/compensation, DME instructions, Cryotherapy, and Moist heat.  All performed as medically necessary.  All included unless contraindicated  PLAN FOR NEXT SESSION: Trial HEP.    Ozell Silvan, PT, DPT, OCS, ATC 11/28/23  10:26 AM   PHYSICAL THERAPY DISCHARGE SUMMARY  Visits from Start of Care: 4  Current functional level related to goals / functional outcomes: See note   Remaining deficits: See note   Education / Equipment: HEP  Patient goals were met. Patient is being discharged due to not returning since the last visit.  Ozell Silvan, PT, DPT, OCS, ATC 02/18/24  9:45 AM

## 2023-12-03 ENCOUNTER — Encounter: Admitting: Rehabilitative and Restorative Service Providers"

## 2023-12-10 ENCOUNTER — Ambulatory Visit: Admitting: Orthopedic Surgery

## 2023-12-16 LAB — GENECONNECT MOLECULAR SCREEN

## 2023-12-18 ENCOUNTER — Telehealth: Payer: Self-pay | Admitting: Medical Genetics

## 2023-12-18 NOTE — Progress Notes (Signed)
 [facility]  12/18/23 1:08 PM  Confirmed I was speaking with Christian Hawkins [phone] by using name and DOB. Informed participant the reason for this call is to follow-up on a recent blood sample the participant provided at one of the [facility] lab locations. Informed participant the test was not able to be completed with this sample and apologized for the inconvenience. Participant was requested to provide a new sample at one of our participating labs at no cost so that participant can continue participation and receive test results. Participant agreed to provide another sample. Participant was provided the [employer] program website to learn why this may have happened. Participant was thanked for their time and continued support of the above study.

## 2023-12-22 ENCOUNTER — Other Ambulatory Visit: Payer: Self-pay | Admitting: Medical Genetics

## 2023-12-22 DIAGNOSIS — Z006 Encounter for examination for normal comparison and control in clinical research program: Secondary | ICD-10-CM

## 2023-12-22 NOTE — Progress Notes (Unsigned)
 Initial result was a TNP. New order requested. Confirmed consent on file.

## 2024-01-20 ENCOUNTER — Encounter: Payer: Self-pay | Admitting: Physical Therapy

## 2024-01-20 ENCOUNTER — Other Ambulatory Visit (HOSPITAL_COMMUNITY)
Admission: RE | Admit: 2024-01-20 | Discharge: 2024-01-20 | Disposition: A | Discharge status: Home / Self Care | Source: Ambulatory Visit | Attending: Oncology | Admitting: Oncology

## 2024-01-20 DIAGNOSIS — Z006 Encounter for examination for normal comparison and control in clinical research program: Secondary | ICD-10-CM | POA: Insufficient documentation

## 2024-01-27 LAB — GENECONNECT MOLECULAR SCREEN: Genetic Analysis Overall Interpretation: NEGATIVE

## 2024-06-21 ENCOUNTER — Encounter: Payer: Self-pay | Admitting: Radiology
